# Patient Record
Sex: Male | Born: 1985 | Race: Black or African American | Hispanic: No | Marital: Married | Smoking: Never smoker
Health system: Southern US, Community
[De-identification: ages and names within clinical notes are randomized; demographics above are authoritative.]

## PROBLEM LIST (undated history)

## (undated) DIAGNOSIS — R002 Palpitations: Secondary | ICD-10-CM

## (undated) DIAGNOSIS — R42 Dizziness and giddiness: Secondary | ICD-10-CM

## (undated) DIAGNOSIS — R7989 Other specified abnormal findings of blood chemistry: Secondary | ICD-10-CM

## (undated) HISTORY — PX: NO PAST SURGERIES: SHX2092

## (undated) HISTORY — PX: WISDOM TOOTH EXTRACTION: SHX21

## (undated) HISTORY — DX: Other specified abnormal findings of blood chemistry: R79.89

## (undated) HISTORY — DX: Dizziness and giddiness: R42

## (undated) HISTORY — DX: Palpitations: R00.2

---

## 2019-08-31 NOTE — Progress Notes (Signed)
Cardiology Office Note:    Date:  09/01/2019   ID:  Louis Warren, DOB 1986-02-28, MRN [id number]  PCP:  Sueanne Margarita, DO  Cardiologist:  Shirlee More, MD   Referring MD: [facility]  ASSESSMENT:    1. Palpitations   2. FH: CAD (coronary artery disease)    PLAN:    In order of problems listed above:  1. He presents now with cardiac concerns as detailed to evaluate palpitation utilize the iPhone adapter and if we document arrhythmia see him back in the office sooner otherwise follow-up in 6 months to review strips.  Echocardiogram to exclude cardiomyopathy.  Lifestyle modification to reduce his lifelong risk of diabetes.  And finally avoiding over-the-counter proarrhythmic drugs that can precipitate arrhythmia.  Next appointment in 6 months   Medication Adjustments/Labs and Tests Ordered: Current medicines are reviewed at length with the patient today.  Concerns regarding medicines are outlined above.  No orders of the defined types were placed in this encounter.  No orders of the defined types were placed in this encounter.    Chief Complaint  Patient presents with  . Palpitations    History of Present Illness:    Louis Warren is a 33 y.o. male who is being seen today for the evaluation of palpitation at the request of Pa, [facility]  Tramon has become cardiac concern.  The first issue is rarely he gets brief episode of rapid heart rhythm perhaps once a month severe or sustained.  Uses over-the-counter sedating antihistamines.  His second concern is his mother with diabetes and barriers to health care died in her 31s of heart disease.  Very vigorous active man and rarely gets discomfort in the left chest he describes a soreness is nonanginal in nature better with massage and resolve spontaneously he has no exertional chest pain dyspnea or syncope.  Is no history of congenital or rheumatic heart disease.  His hemoglobin A1c is ideal lipids are good  and EKG is normal.  We discussed further evaluation and I think the best approach is to use the adapter for the smart phone to capture episodes occur so infrequently.  With his family history nonanginal chest pain palpitation echocardiogram to exclude underlying structural heart disease particularly cardiomyopathy and I do not think he requires an ischemia evaluation or coronary calcium score at this time.  I strongly encouraged him regarding lifestyle avoiding weight gain and inactivity being everything possible to avoid diabetes and prediabetes.  He has good healthcare literacy and comprehension. Past Medical History:  Diagnosis Date  . Dizziness   . Palpitations     Past Surgical History:  Procedure Laterality Date  . NO PAST SURGERIES      Current Medications: No outpatient medications have been marked as taking for the 09/01/19 encounter (Office Visit) with Richardo Priest, MD.     Allergies:   Patient has no known allergies.   Social History   Socioeconomic History  . Marital status: Married    Spouse name: Not on file  . Number of children: Not on file  . Years of education: Not on file  . Highest education level: Not on file  Occupational History  . Not on file  Social Needs  . Financial resource strain: Not on file  . Food insecurity    Worry: Not on file    Inability: Not on file  . Transportation needs    Medical: Not on file    Non-medical: Not  on file  Tobacco Use  . Smoking status: Never Smoker  . Smokeless tobacco: Never Used  Substance and Sexual Activity  . Alcohol use: Yes    Comment: occ-1 time per month  . Drug use: Never  . Sexual activity: Not on file  Lifestyle  . Physical activity    Days per week: Not on file    Minutes per session: Not on file  . Stress: Not on file  Relationships  . Social Musician on phone: Not on file    Gets together: Not on file    Attends religious service: Not on file    Active member of club or  organization: Not on file    Attends meetings of clubs or organizations: Not on file    Relationship status: Not on file  Other Topics Concern  . Not on file  Social History Narrative  . Not on file     Family History: The patient's family history includes Diabetes in his brother, father, maternal grandmother, and mother; Heart disease in his mother; Hypertension in his mother.  ROS:   Review of Systems  Constitution: Negative.  HENT: Positive for congestion.   Eyes: Negative.   Cardiovascular: Positive for chest pain and palpitations.  Respiratory: Negative.   Endocrine: Negative.   Skin: Negative.   Musculoskeletal: Positive for back pain.  Gastrointestinal: Negative.   Genitourinary: Negative.   Neurological: Negative.   Psychiatric/Behavioral: Negative.   Allergic/Immunologic: Negative.    Please see the history of present illness.     All other systems reviewed and are negative.  EKGs/Labs/Other Studies Reviewed:    The following studies were reviewed today:   EKG:  EKG is  ordered today.  The ekg ordered today is personally reviewed and demonstrates sinus rhythm EKG is normal  Recent Labs: IMA globin A1c 5.2% cholesterol 201 HDL 68 LDL 122 creatinine 9.24 normal TSH  Physical Exam:    VS:  BP 122/78 (BP Location: Left Arm, Patient Position: Sitting, Cuff Size: Large)   Pulse 68   Ht 6\' 5"  (1.956 m)   Wt 236 lb (107 kg)   SpO2 98%   BMI 27.99 kg/m     Wt Readings from Last 3 Encounters:  09/01/19 236 lb (107 kg)     GEN:  Well nourished, well developed in no acute distress HEENT: Normal NECK: No JVD; No carotid bruits LYMPHATICS: No lymphadenopathy CARDIAC: RRR, no murmurs, rubs, gallops RESPIRATORY:  Clear to auscultation without rales, wheezing or rhonchi  ABDOMEN: Soft, non-tender, non-distended MUSCULOSKELETAL:  No edema; No deformity  SKIN: Warm and dry NEUROLOGIC:  Alert and oriented x 3 PSYCHIATRIC:  Normal affect     Signed, 09/03/19, MD  09/01/2019 1:36 PM    [facility]

## 2019-09-01 ENCOUNTER — Other Ambulatory Visit: Payer: Self-pay

## 2019-09-01 ENCOUNTER — Encounter: Payer: Self-pay | Admitting: Cardiology

## 2019-09-01 ENCOUNTER — Ambulatory Visit (INDEPENDENT_AMBULATORY_CARE_PROVIDER_SITE_OTHER): Payer: BC Managed Care – PPO | Admitting: Cardiology

## 2019-09-01 VITALS — BP 122/78 | HR 68 | Ht 77.0 in | Wt 236.0 lb

## 2019-09-01 DIAGNOSIS — R002 Palpitations: Secondary | ICD-10-CM

## 2019-09-01 DIAGNOSIS — R42 Dizziness and giddiness: Secondary | ICD-10-CM | POA: Diagnosis not present

## 2019-09-01 DIAGNOSIS — Z8249 Family history of ischemic heart disease and other diseases of the circulatory system: Secondary | ICD-10-CM

## 2019-09-01 NOTE — Patient Instructions (Addendum)
Medication Instructions:  Your physician recommends that you continue on your current medications as directed. Please refer to the Current Medication list given to you today.  *If you need a refill on your cardiac medications before your next appointment, please call your pharmacy*  Lab Work: NONE If you have labs (blood work) drawn today and your tests are completely normal, you will receive your results only by: Marland Kitchen MyChart Message (if you have MyChart) OR . A paper copy in the mail If you have any lab test that is abnormal or we need to change your treatment, we will call you to review the results.  Testing/Procedures: You had an EKG Today  Your physician has requested that you have an echocardiogram. Echocardiography is a painless test that uses sound waves to create images of your heart. It provides your doctor with information about the size and shape of your heart and how well your heart's chambers and valves are working. This procedure takes approximately one hour. There are no restrictions for this procedure.    Follow-Up: At [facility], you and your health needs are our priority.  As part of our continuing mission to provide you with exceptional heart care, we have created designated Provider Care Teams.  These Care Teams include your primary Cardiologist (physician) and Advanced Practice Providers (APPs -  Physician Assistants and Nurse Practitioners) who all work together to provide you with the care you need, when you need it.  Your next appointment:   6 months  The format for your next appointment:   In Person  Provider:   Norman Herrlich, MD    [facility] [email or web address]        FDA-cleared, clinical grade mobile EKG monitor: Lourena Simmonds is the most clinically-validated mobile EKG used by the world's leading cardiac care medical professionals With Basic service, know instantly if your heart rhythm is normal or if atrial fibrillation  is detected, and email the last single EKG recording to yourself or your doctor Premium service, available for purchase through the Kardia app for $9.99 per month or $99 per year, includes unlimited history and storage of your EKG recordings, a monthly EKG summary report to share with your doctor, along with the ability to track your blood pressure, activity and weight Includes one KardiaMobile phone clip FREE SHIPPING: Standard delivery 1-3 business days. Orders placed by 11:00am PST will ship that afternoon. Otherwise, will ship next business day. All orders ship via [employer] from [place], [place]    1. Avoid all over-the-counter antihistamines except Claritin/Loratadine and Zyrtec/Cetrizine. 2. Avoid all combination including cold sinus allergies flu decongestant and sleep medications 3. You can use Robitussin DM Mucinex and Mucinex DM for cough. 4. can use Tylenol aspirin ibuprofen and naproxen but no combinations such as sleep or sinus.    Echocardiogram An echocardiogram is a procedure that uses painless sound waves (ultrasound) to produce an image of the heart. Images from an echocardiogram can provide important information about:  Signs of coronary artery disease (CAD).  Aneurysm detection. An aneurysm is a weak or damaged part of an artery wall that bulges out from the normal force of blood pumping through the body.  Heart size and shape. Changes in the size or shape of the heart can be associated with certain conditions, including heart failure, aneurysm, and CAD.  Heart muscle function.  Heart valve function.  Signs of a past heart attack.  Fluid buildup around the heart.  Thickening of the heart muscle.  A tumor  or infectious growth around the heart valves. Tell a health care provider about:  Any allergies you have.  All medicines you are taking, including vitamins, herbs, eye drops, creams, and over-the-counter medicines.  Any blood disorders you have.  Any  surgeries you have had.  Any medical conditions you have.  Whether you are pregnant or may be pregnant. What are the risks? Generally, this is a safe procedure. However, problems may occur, including:  Allergic reaction to dye (contrast) that may be used during the procedure. What happens before the procedure? No specific preparation is needed. You may eat and drink normally. What happens during the procedure?   An IV tube may be inserted into one of your veins.  You may receive contrast through this tube. A contrast is an injection that improves the quality of the pictures from your heart.  A gel will be applied to your chest.  A wand-like tool (transducer) will be moved over your chest. The gel will help to transmit the sound waves from the transducer.  The sound waves will harmlessly bounce off of your heart to allow the heart images to be captured in real-time motion. The images will be recorded on a computer. The procedure may vary among health care providers and hospitals. What happens after the procedure?  You may return to your normal, everyday life, including diet, activities, and medicines, unless your health care provider tells you not to do that. Summary  An echocardiogram is a procedure that uses painless sound waves (ultrasound) to produce an image of the heart.  Images from an echocardiogram can provide important information about the size and shape of your heart, heart muscle function, heart valve function, and fluid buildup around your heart.  You do not need to do anything to prepare before this procedure. You may eat and drink normally.  After the echocardiogram is completed, you may return to your normal, everyday life, unless your health care provider tells you not to do that. This information is not intended to replace advice given to you by your health care provider. Make sure you discuss any questions you have with your health care provider. Document  Released: 10/24/2000 Document Revised: 02/17/2019 Document Reviewed: 11/29/2016 Elsevier Patient Education  2020 [employer].

## 2019-09-08 ENCOUNTER — Ambulatory Visit (HOSPITAL_BASED_OUTPATIENT_CLINIC_OR_DEPARTMENT_OTHER)
Admission: RE | Admit: 2019-09-08 | Discharge: 2019-09-08 | Disposition: A | Discharge status: Home / Self Care | Payer: BC Managed Care – PPO | Source: Ambulatory Visit | Attending: Cardiology | Admitting: Cardiology

## 2019-09-08 ENCOUNTER — Other Ambulatory Visit: Payer: Self-pay

## 2019-09-08 DIAGNOSIS — Z8249 Family history of ischemic heart disease and other diseases of the circulatory system: Secondary | ICD-10-CM | POA: Diagnosis present

## 2019-09-08 DIAGNOSIS — R42 Dizziness and giddiness: Secondary | ICD-10-CM | POA: Insufficient documentation

## 2019-09-08 DIAGNOSIS — R002 Palpitations: Secondary | ICD-10-CM | POA: Diagnosis not present

## 2019-09-08 NOTE — Progress Notes (Signed)
  Echocardiogram 2D Echocardiogram has been performed.  Louis Warren 09/08/2019, 8:47 AM

## 2020-04-23 ENCOUNTER — Other Ambulatory Visit: Payer: Self-pay | Admitting: Internal Medicine

## 2020-04-23 DIAGNOSIS — R7989 Other specified abnormal findings of blood chemistry: Secondary | ICD-10-CM

## 2020-05-01 ENCOUNTER — Ambulatory Visit
Admission: RE | Admit: 2020-05-01 | Discharge: 2020-05-01 | Disposition: A | Discharge status: Home / Self Care | Payer: BC Managed Care – PPO | Source: Ambulatory Visit | Attending: Internal Medicine | Admitting: Internal Medicine

## 2020-05-01 DIAGNOSIS — R7989 Other specified abnormal findings of blood chemistry: Secondary | ICD-10-CM

## 2020-06-25 ENCOUNTER — Encounter: Payer: Self-pay | Admitting: Gastroenterology

## 2020-08-21 ENCOUNTER — Encounter: Payer: Self-pay | Admitting: *Deleted

## 2020-08-23 ENCOUNTER — Encounter: Payer: Self-pay | Admitting: Gastroenterology

## 2020-08-23 ENCOUNTER — Ambulatory Visit (INDEPENDENT_AMBULATORY_CARE_PROVIDER_SITE_OTHER): Payer: BC Managed Care – PPO | Admitting: Gastroenterology

## 2020-08-23 VITALS — BP 108/88 | HR 58 | Ht 77.0 in | Wt 236.0 lb

## 2020-08-23 DIAGNOSIS — R748 Abnormal levels of other serum enzymes: Secondary | ICD-10-CM

## 2020-08-23 NOTE — Patient Instructions (Addendum)
Your provider has requested that you go to the basement level for lab work after fasting (no food or drink with the exception of water) for at least 8 hours. Our lab is open from Monday 7:30 am-Friday 5:00 pm. Press "B" on the elevator. The lab is located at the first door on the left as you exit the elevator.  We will make further recommendations for additional labs after reviewing results of the labs from today as well as information from [facility].  If your enzymes remain elevated, without an answer, will proceed with an MRI/MRCP for further evaluation.  If you are age 35 or younger, your body mass index should be between 19-25. Your Body mass index is 27.99 kg/m. If this is out of the aformentioned range listed, please consider follow up with your Primary Care Provider.   Due to recent changes in healthcare laws, you may see the results of your imaging and laboratory studies on MyChart before your provider has had a chance to review them.  We understand that in some cases there may be results that are confusing or concerning to you. Not all laboratory results come back in the same time frame and the provider may be waiting for multiple results in order to interpret others.  Please give [place] 48 hours in order for your provider to thoroughly review all the results before contacting the office for clarification of your results.

## 2020-08-23 NOTE — Progress Notes (Signed)
Referring Provider: Sueanne Margarita, DO Primary Care Physician:  Sueanne Margarita, DO  Reason for Consultation:  Abnormal liver enzymes   IMPRESSION:  Abnormal transaminases ALT>AST    - normal ultrasound 05/01/20    - testing for chronic HBV and HCV was negative Recent unintentional weight gain [place] Fat burner product use, no longer using  Suspected hepatocellular process given the elevated transaminases. Will proceed with labs to evaluate for common causes of hepatocellular injury including: iron, ferritin, fasting insulin, fasting glucose,and autoimmune type I (ANA, AMA, IgG, IGM), ceruloplasm. If these labs are non-diagnostic, will proceed with alpha-1-antitrypsin, anti-LKM antibody, TSH, and celiac serologies.  Low threshold to proceed with MRI/MRCP.     PLAN: - Obtain labs and ultrasound results from Dr. Francesco Sor (hepatitis serologies) - fasting ferritin, fasting insulin, fasting glucose, iron, ANA, AMA, anti-smooth muscle antibody, IgG, IgM. ceruloplasmin - Will make recommendations for additional labs after reviewing those results. If the enzymes remain elevated without an answer will proceed with MRI/MRCP.   Please see the "Patient Instructions" section for addition details about the plan.  HPI: Louis Warren is a 34 y.o. male referred by Dr. Francesco Sor for abnormal liver enzymes. The history is obtained through the patient, some records provided by Dr. Francesco Sor, and the labs that the patient accesses on his electronic health record over the phone during his visit.    He has a history of eczema. 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 His wife works for [employer].   The patient notes that his liver enzymes have been found to be elevated on two consecutive lab draws. No prior knowledge of elevated liver enzymes. No associated symptoms.   Results available to me through records from Dr. Francesco Sor and the patient's online medical record that he accessed during this visit AST 59, ALT 96, alk phos  78 HBsAb immunity, HCV Ab, HsAg HBcAb negative Abdominal ultrasound was normal 05/01/20  Prior blood donation 5-10 years.  No prior blood transfusion.  No history of jaundice or scleral icterus.  No history of use or experimentation with IV or intranasal street drugs.  Will drink wine or beer - one every 1-3 days, sometimes not more than monthly. No history of heavy drinking or DUI. No history of autoimmune disease.  No family history of liver disease.  Used a Fat Burner [employer] supplement 2-3 times weekly.  He has recently gained 5-10 pounds due to inactivity.   He think he was vaccinated for HAV and HBV  No known family history of colon cancer or polyps. No family history of uterine/endometrial cancer, pancreatic cancer or gastric/stomach cancer.   Past Medical History:  Diagnosis Date  . Dizziness   . Elevated LFTs   . Palpitations     Past Surgical History:  Procedure Laterality Date  . NO PAST SURGERIES    . WISDOM TOOTH EXTRACTION      No current outpatient medications on file.   No current facility-administered medications for this visit.    Allergies as of 08/23/2020  . (No Known Allergies)    Family History  Problem Relation Age of Onset  . Heart disease Mother   . Hypertension Mother   . Diabetes Mother   . Diabetes Father   . Diabetes Brother   . Diabetes Maternal Grandmother   . Colon cancer Neg Hx   . Stomach cancer Neg Hx   . Esophageal cancer Neg Hx   . Pancreatic cancer Neg Hx     Social History  Socioeconomic History  . Marital status: Married    Spouse name: Not on file  . Number of children: Not on file  . Years of education: Not on file  . Highest education level: Not on file  Occupational History  . Not on file  Tobacco Use  . Smoking status: Never Smoker  . Smokeless tobacco: Never Used  Vaping Use  . Vaping Use: Never used  Substance and Sexual Activity  . Alcohol use: Yes    Comment: occ-1 time per month  . Drug use: Never  .  Sexual activity: Not on file  Other Topics Concern  . Not on file  Social History Narrative  . Not on file   Social Determinants of Health   Financial Resource Strain:   . Difficulty of Paying Living Expenses: Not on file  Food Insecurity:   . Worried About Charity fundraiser in the Last Year: Not on file  . Ran Out of Food in the Last Year: Not on file  Transportation Needs:   . Lack of Transportation (Medical): Not on file  . Lack of Transportation (Non-Medical): Not on file  Physical Activity:   . Days of Exercise per Week: Not on file  . Minutes of Exercise per Session: Not on file  Stress:   . Feeling of Stress : Not on file  Social Connections:   . Frequency of Communication with Friends and Family: Not on file  . Frequency of Social Gatherings with Friends and Family: Not on file  . Attends Religious Services: Not on file  . Active Member of Clubs or Organizations: Not on file  . Attends Archivist Meetings: Not on file  . Marital Status: Not on file  Intimate Partner Violence:   . Fear of Current or Ex-Partner: Not on file  . Emotionally Abused: Not on file  . Physically Abused: Not on file  . Sexually Abused: Not on file    Review of Systems: 12 system ROS is negative except as noted above.   Physical Exam: General:   Alert, in NAD. No scleral icterus. No bilateral temporal wasting.  Heart:  Regular rate and rhythm; no murmurs Pulm: Clear anteriorly; no wheezing Abdomen:  Soft. Nontender. Nondistended. Normal bowel sounds. No rebound or guarding. No fluid wave.  LAD: No inguinal or umbilical LAD Extremities:  Without edema. Neurologic:  Alert and  oriented x4;  grossly normal neurologically; no asterixis or clonus. Skin: No jaundice. No palmar erythema or spider angioma.  Psych:  Alert and cooperative. Normal mood and affect.    Kippy Melena L. Tarri Glenn, MD, MPH 08/23/2020, 2:41 PM

## 2020-08-24 ENCOUNTER — Other Ambulatory Visit (INDEPENDENT_AMBULATORY_CARE_PROVIDER_SITE_OTHER): Payer: BC Managed Care – PPO

## 2020-08-24 DIAGNOSIS — R748 Abnormal levels of other serum enzymes: Secondary | ICD-10-CM | POA: Diagnosis not present

## 2020-08-24 LAB — FERRITIN: Ferritin: 202.7 ng/mL (ref 22.0–322.0)

## 2020-08-24 LAB — IRON: Iron: 72 ug/dL (ref 42–165)

## 2020-08-24 LAB — GLUCOSE, RANDOM: Glucose, Bld: 89 mg/dL (ref 70–99)

## 2020-08-28 LAB — IGG: IgG (Immunoglobin G), Serum: 2139 mg/dL — ABNORMAL HIGH (ref 600–1640)

## 2020-08-28 LAB — CERULOPLASMIN: Ceruloplasmin: 35 mg/dL (ref 18–36)

## 2020-08-28 LAB — IGM: IgM, Serum: 82 mg/dL (ref 50–300)

## 2020-08-28 LAB — ANTI-SMOOTH MUSCLE ANTIBODY, IGG: Actin (Smooth Muscle) Antibody (IGG): 30 U — ABNORMAL HIGH (ref <20)

## 2020-08-28 LAB — MITOCHONDRIAL ANTIBODIES: Mitochondrial M2 Ab, IgG: <=20 U

## 2020-08-28 LAB — ANA: Anti Nuclear Antibody (ANA): NEGATIVE

## 2020-09-01 LAB — INSULIN, FREE AND TOTAL
Free Insulin: 12 uU/mL
Total Insulin: 12 uU/mL

## 2020-09-06 ENCOUNTER — Other Ambulatory Visit: Payer: Self-pay

## 2020-09-06 DIAGNOSIS — R748 Abnormal levels of other serum enzymes: Secondary | ICD-10-CM

## 2020-09-24 ENCOUNTER — Other Ambulatory Visit: Payer: Self-pay | Admitting: Radiology

## 2020-09-24 ENCOUNTER — Other Ambulatory Visit: Payer: Self-pay | Admitting: Student

## 2020-09-25 ENCOUNTER — Other Ambulatory Visit: Payer: Self-pay

## 2020-09-25 ENCOUNTER — Ambulatory Visit (HOSPITAL_COMMUNITY)
Admission: RE | Admit: 2020-09-25 | Discharge: 2020-09-25 | Disposition: A | Discharge status: Home / Self Care | Payer: BC Managed Care – PPO | Source: Ambulatory Visit | Attending: Gastroenterology | Admitting: Gastroenterology

## 2020-09-25 ENCOUNTER — Encounter (HOSPITAL_COMMUNITY): Payer: Self-pay

## 2020-09-25 DIAGNOSIS — K760 Fatty (change of) liver, not elsewhere classified: Secondary | ICD-10-CM | POA: Diagnosis not present

## 2020-09-25 DIAGNOSIS — R748 Abnormal levels of other serum enzymes: Secondary | ICD-10-CM | POA: Diagnosis present

## 2020-09-25 LAB — CBC
HCT: 46.2 % (ref 39.0–52.0)
Hemoglobin: 14.8 g/dL (ref 13.0–17.0)
MCH: 28.2 pg (ref 26.0–34.0)
MCHC: 32 g/dL (ref 30.0–36.0)
MCV: 88 fL (ref 80.0–100.0)
Platelets: 265 10*3/uL (ref 150–400)
RBC: 5.25 MIL/uL (ref 4.22–5.81)
RDW: 13.2 % (ref 11.5–15.5)
WBC: 4.2 10*3/uL (ref 4.0–10.5)
nRBC: 0 % (ref 0.0–0.2)

## 2020-09-25 LAB — APTT: aPTT: 29 s (ref 24–36)

## 2020-09-25 LAB — PROTIME-INR
INR: 1 (ref 0.8–1.2)
Prothrombin Time: 12.3 s (ref 11.4–15.2)

## 2020-09-25 MED ORDER — FENTANYL CITRATE (PF) 100 MCG/2ML IJ SOLN
INTRAMUSCULAR | Status: AC
  Filled 2020-09-25: qty 2

## 2020-09-25 MED ORDER — LIDOCAINE HCL (PF) 1 % IJ SOLN
INTRAMUSCULAR | Status: AC
  Filled 2020-09-25: qty 30

## 2020-09-25 MED ORDER — GELATIN ABSORBABLE 12-7 MM EX MISC
CUTANEOUS | Status: AC
  Filled 2020-09-25: qty 1

## 2020-09-25 MED ORDER — MIDAZOLAM HCL 2 MG/2ML IJ SOLN
INTRAMUSCULAR | Status: AC | PRN
  Administered 2020-09-25: 1 mg via INTRAVENOUS
  Administered 2020-09-25 (×2): 0.5 mg via INTRAVENOUS

## 2020-09-25 MED ORDER — FENTANYL CITRATE (PF) 100 MCG/2ML IJ SOLN
INTRAMUSCULAR | Status: AC | PRN
Start: 2020-09-25 — End: 2020-09-25
  Administered 2020-09-25: 25 ug via INTRAVENOUS
  Administered 2020-09-25: 50 ug via INTRAVENOUS
  Administered 2020-09-25: 25 ug via INTRAVENOUS

## 2020-09-25 MED ORDER — SODIUM CHLORIDE 0.9 % IV SOLN: INTRAVENOUS | Status: DC

## 2020-09-25 MED ORDER — MIDAZOLAM HCL 2 MG/2ML IJ SOLN
INTRAMUSCULAR | Status: AC
  Filled 2020-09-25: qty 2

## 2020-09-25 MED ORDER — SODIUM CHLORIDE 0.9 % IV SOLN
INTRAVENOUS | Status: AC | PRN
  Administered 2020-09-25: 10 mL/h via INTRAVENOUS

## 2020-09-25 MED ORDER — HYDROCODONE-ACETAMINOPHEN 5-325 MG PO TABS: 1.0000 | ORAL_TABLET | ORAL | Status: DC | PRN

## 2020-09-25 NOTE — Progress Notes (Signed)
Patient was given discharge instructions. He verbalized understanding. 

## 2020-09-25 NOTE — Discharge Instructions (Addendum)
Liver Biopsy, Care After These instructions give you information on caring for yourself after your procedure. Your doctor may also give you more specific instructions. Call your doctor if you have any problems or questions after your procedure. What can I expect after the procedure? After the procedure, it is common to have:  Pain and soreness where the biopsy was done.  Bruising around the area where the biopsy was done.  Sleepiness and be tired for a few days. Follow these instructions at home: Medicines  Take over-the-counter and prescription medicines only as told by your doctor.  If you were prescribed an antibiotic medicine, take it as told by your doctor. Do not stop taking the antibiotic even if you start to feel better.  Do not take medicines such as aspirin and ibuprofen. These medicines can thin your blood. Do not take these medicines unless your doctor tells you to take them.  If you are taking prescription pain medicine, take actions to prevent or treat constipation. Your doctor may recommend that you: ? Drink enough fluid to keep your pee (urine) clear or pale yellow. ? Take over-the-counter or prescription medicines. ? Eat foods that are high in fiber, such as fresh fruits and vegetables, whole grains, and beans. ? Limit foods that are high in fat and processed sugars, such as fried and sweet foods. Caring for your cut  Follow instructions from your doctor about how to take care of your cuts from surgery (incisions). Make sure you: ? Wash your hands with soap and water before you change your bandage (dressing). If you cannot use soap and water, use hand sanitizer. ? Change your bandage as told by your doctor. ? Leave stitches (sutures), skin glue, or skin tape (adhesive) strips in place. They may need to stay in place for 2 weeks or longer. If tape strips get loose and curl up, you may trim the loose edges. Do not remove tape strips completely unless your doctor says it is  okay.  Check your cuts every day for signs of infection. Check for: ? Redness, swelling, or more pain. ? Fluid or blood. ? Pus or a bad smell. ? Warmth.  Do not take baths, swim, or use a hot tub until your doctor says it is okay to do so. Activity   Rest at home for 1-2 days or as told by your doctor. ? Avoid sitting for a long time without moving. Get up to take short walks every 1-2 hours.  Return to your normal activities as told by your doctor. Ask what activities are safe for you.  Do not do these things in the first 24 hours: ? Drive. ? Use machinery. ? Take a bath or shower.  Do not lift more than 10 pounds (4.5 kg) or play contact sports for the first 2 weeks. General instructions   Do not drink alcohol in the first week after the procedure.  Have someone stay with you for at least 24 hours after the procedure.  Get your test results. Ask your doctor or the department that is doing the test: ? When will my results be ready? ? How will I get my results? ? What are my treatment options? ? What other tests do I need? ? What are my next steps?  Keep all follow-up visits as told by your doctor. This is important. Contact a doctor if:  A cut bleeds and leaves more than just a small spot of blood.  A cut is red, puffs up (  swells), or hurts more than before.  Fluid or something else comes from a cut.  A cut smells bad.  You have a fever or chills. Get help right away if:  You have swelling, bloating, or pain in your belly (abdomen).  You get dizzy or faint.  You have a rash.  You feel sick to your stomach (nauseous) or throw up (vomit).  You have trouble breathing, feel short of breath, or feel faint.  Your chest hurts.  You have problems talking or seeing.  You have trouble with your balance or moving your arms or legs. Summary  After the procedure, it is common to have pain, soreness, bruising, and tiredness.  Your doctor will tell you how to  take care of yourself at home. Change your bandage, take your medicines, and limit your activities as told by your doctor.  Call your doctor if you have symptoms of infection. Get help right away if your belly swells, your cut bleeds a lot, or you have trouble talking or breathing. This information is not intended to replace advice given to you by your health care provider. Make sure you discuss any questions you have with your health care provider. Document Revised: 11/06/2017 Document Reviewed: 11/06/2017 Elsevier Patient Education  2020 [employer]. Moderate Conscious Sedation, Adult Sedation is the use of medicines to promote relaxation and relieve discomfort and anxiety. Moderate conscious sedation is a type of sedation. Under moderate conscious sedation, you are less alert than normal, but you are still able to respond to instructions, touch, or both. Moderate conscious sedation is used during short medical and dental procedures. It is milder than deep sedation, which is a type of sedation under which you cannot be easily woken up. It is also milder than general anesthesia, which is the use of medicines to make you unconscious. Moderate conscious sedation allows you to return to your regular activities sooner. Tell a health care provider about:  Any allergies you have.  All medicines you are taking, including vitamins, herbs, eye drops, creams, and over-the-counter medicines.  Use of steroids (by mouth or creams).  Any problems you or family members have had with sedatives and anesthetic medicines.  Any blood disorders you have.  Any surgeries you have had.  Any medical conditions you have, such as sleep apnea.  Whether you are pregnant or may be pregnant.  Any use of cigarettes, alcohol, marijuana, or street drugs. What are the risks? Generally, this is a safe procedure. However, problems may occur, including:  Getting too much medicine (oversedation).  Nausea.  Allergic  reaction to medicines.  Trouble breathing. If this happens, a breathing tube may be used to help with breathing. It will be removed when you are awake and breathing on your own.  Heart trouble.  Lung trouble. What happens before the procedure? Staying hydrated Follow instructions from your health care provider about hydration, which may include:  Up to 2 hours before the procedure - you may continue to drink clear liquids, such as water, clear fruit juice, black coffee, and plain tea. Eating and drinking restrictions Follow instructions from your health care provider about eating and drinking, which may include:  8 hours before the procedure - stop eating heavy meals or foods such as meat, fried foods, or fatty foods.  6 hours before the procedure - stop eating light meals or foods, such as toast or cereal.  6 hours before the procedure - stop drinking milk or drinks that contain milk.  2   hours before the procedure - stop drinking clear liquids. Medicine Ask your health care provider about:  Changing or stopping your regular medicines. This is especially important if you are taking diabetes medicines or blood thinners.  Taking medicines such as aspirin and ibuprofen. These medicines can thin your blood. Do not take these medicines before your procedure if your health care provider instructs you not to.  Tests and exams  You will have a physical exam.  You may have blood tests done to show: ? How well your kidneys and liver are working. ? How well your blood can clot. General instructions  Plan to have someone take you home from the hospital or clinic.  If you will be going home right after the procedure, plan to have someone with you for 24 hours. What happens during the procedure?  An IV tube will be inserted into one of your veins.  Medicine to help you relax (sedative) will be given through the IV tube.  The medical or dental procedure will be performed. What  happens after the procedure?  Your blood pressure, heart rate, breathing rate, and blood oxygen level will be monitored often until the medicines you were given have worn off.  Do not drive for 24 hours. This information is not intended to replace advice given to you by your health care provider. Make sure you discuss any questions you have with your health care provider. Document Revised: 10/09/2017 Document Reviewed: 02/16/2016 Elsevier Patient Education  2020 Elsevier Inc.  

## 2020-09-25 NOTE — H&P (Signed)
Chief Complaint: Patient was seen in consultation today for random liver biopsy at the request of Beavers,Kimberly  Referring Physician(s): Beavers,Kimberly  Supervising Physician: Ruel Favors  Patient Status: [facility] - Out-pt  History of Present Illness: Louis Warren is a 34 y.o. male   Known elevated liver enzymes x almost 1 yr Denies abd pain; N/V; jaundice BMs wnl No drug use Hx Little to no alcohol  Was referred to Dr Orvan Falconer for eval:  Abnormal transaminases ALT>AST    - normal ultrasound 05/01/20    - testing for chronic HBV and HCV was negative  Scheduled now for liver core biopsy  Past Medical History:  Diagnosis Date  . Dizziness   . Elevated LFTs   . Palpitations     Past Surgical History:  Procedure Laterality Date  . NO PAST SURGERIES    . WISDOM TOOTH EXTRACTION      Allergies: Patient has no known allergies.  Medications: Prior to Admission medications   Medication Sig Start Date End Date Taking? Authorizing Provider  tretinoin (RETIN-A) 0.1 % cream Apply 1 application topically daily as needed (acne).  06/08/20  Yes [provider]     Family History  Problem Relation Age of Onset  . Heart disease Mother   . Hypertension Mother   . Diabetes Mother   . Diabetes Father   . Diabetes Brother   . Diabetes Maternal Grandmother   . Colon cancer Neg Hx   . Stomach cancer Neg Hx   . Esophageal cancer Neg Hx   . Pancreatic cancer Neg Hx     Social History   Socioeconomic History  . Marital status: Married    Spouse name: Not on file  . Number of children: Not on file  . Years of education: Not on file  . Highest education level: Not on file  Occupational History  . Not on file  Tobacco Use  . Smoking status: Never Smoker  . Smokeless tobacco: Never Used  Vaping Use  . Vaping Use: Never used  Substance and Sexual Activity  . Alcohol use: Yes    Comment: occ-1 time per month  . Drug use: Never  . Sexual activity: Not  on file  Other Topics Concern  . Not on file  Social History Narrative  . Not on file   Social Determinants of Health   Financial Resource Strain:   . Difficulty of Paying Living Expenses: Not on file  Food Insecurity:   . Worried About Programme researcher, broadcasting/film/video in the Last Year: Not on file  . Ran Out of Food in the Last Year: Not on file  Transportation Needs:   . Lack of Transportation (Medical): Not on file  . Lack of Transportation (Non-Medical): Not on file  Physical Activity:   . Days of Exercise per Week: Not on file  . Minutes of Exercise per Session: Not on file  Stress:   . Feeling of Stress : Not on file  Social Connections:   . Frequency of Communication with Friends and Family: Not on file  . Frequency of Social Gatherings with Friends and Family: Not on file  . Attends Religious Services: Not on file  . Active Member of Clubs or Organizations: Not on file  . Attends Banker Meetings: Not on file  . Marital Status: Not on file    Review of Systems: A 12 point ROS discussed and pertinent positives are indicated in the HPI above.  All other systems  are negative.  Review of Systems  Constitutional: Negative for activity change, fatigue, fever and unexpected weight change.  Respiratory: Negative for cough and shortness of breath.   Cardiovascular: Negative for chest pain.  Gastrointestinal: Negative for abdominal pain, nausea and vomiting.  Psychiatric/Behavioral: Negative for behavioral problems and confusion.    Vital Signs: BP 125/88   Pulse 67   Temp 98.1 F (36.7 C) (Oral)   Resp 16   Ht 6\' 5"  (1.956 m)   Wt 235 lb (106.6 kg)   SpO2 99%   BMI 27.87 kg/m   Physical Exam Vitals reviewed.  Cardiovascular:     Rate and Rhythm: Normal rate and regular rhythm.     Heart sounds: Normal heart sounds.  Pulmonary:     Effort: Pulmonary effort is normal.     Breath sounds: Normal breath sounds.  Abdominal:     Palpations: Abdomen is soft.      Tenderness: There is no abdominal tenderness.  Musculoskeletal:        General: Normal range of motion.  Skin:    General: Skin is warm.  Neurological:     Mental Status: He is alert and oriented to person, place, and time.  Psychiatric:        Behavior: Behavior normal.     Imaging: No results found.  Labs:  CBC: Recent Labs    09/25/20 0603  WBC 4.2  HGB 14.8  HCT 46.2  PLT 265    COAGS: Recent Labs    09/25/20 0603  INR 1.0  APTT 29    BMP: Recent Labs    08/24/20 0734  GLUCOSE 89    LIVER FUNCTION TESTS: No results for input(s): BILITOT, AST, ALT, ALKPHOS, PROT, ALBUMIN in the last 8760 hours.  TUMOR MARKERS: No results for input(s): AFPTM, CEA, CA199, CHROMGRNA in the last 8760 hours.  Assessment and Plan:  Elevated liver enzymes-- known x 1 yr All other work up neg thus far Now scheduled for random liver core bx Risks and benefits of random liver core bx was discussed with the patient and/or patient's family including, but not limited to bleeding, infection, damage to adjacent structures or low yield requiring additional tests.  All of the questions were answered and there is agreement to proceed. Consent signed and in chart.   Thank you for this interesting consult.  I greatly enjoyed meeting Louis Warren and look forward to participating in their care.  A copy of this report was sent to the requesting provider on this date.  Electronically Signed: Marga Hoots, PA-C 09/25/2020, 7:03 AM   I spent a total of  30 Minutes   in face to face in clinical consultation, greater than 50% of which was counseling/coordinating care for random liver core bx

## 2020-09-25 NOTE — Procedures (Signed)
Interventional Radiology Procedure Note  Procedure: US liver random bx    Complications: None  Estimated Blood Loss:  min  Findings: 18 g cores x 2    M. Ruel Favors, MD

## 2020-09-26 ENCOUNTER — Other Ambulatory Visit: Payer: Self-pay | Admitting: Physician Assistant

## 2020-09-26 LAB — SURGICAL PATHOLOGY

## 2020-10-01 ENCOUNTER — Encounter: Payer: Self-pay | Admitting: Gastroenterology

## 2020-10-01 ENCOUNTER — Ambulatory Visit (INDEPENDENT_AMBULATORY_CARE_PROVIDER_SITE_OTHER): Payer: BC Managed Care – PPO | Admitting: Gastroenterology

## 2020-10-01 VITALS — BP 122/82 | HR 76 | Ht 77.0 in | Wt 240.0 lb

## 2020-10-01 DIAGNOSIS — K760 Fatty (change of) liver, not elsewhere classified: Secondary | ICD-10-CM | POA: Diagnosis not present

## 2020-10-01 DIAGNOSIS — R140 Abdominal distension (gaseous): Secondary | ICD-10-CM

## 2020-10-01 DIAGNOSIS — R748 Abnormal levels of other serum enzymes: Secondary | ICD-10-CM

## 2020-10-01 NOTE — Progress Notes (Signed)
Referring Provider: Charlane Ferretti, DO Primary Care Physician:  Charlane Ferretti, DO  Chief complaint:  Abnormal liver enzymes   IMPRESSION:  Hepatic steatosis with steatohepatitis or fibrosis on liver biopsy 11/21 Abnormal transaminases ALT>AST due to fatty liver HOMA-IR 2.6 Recent unintentional weight gain [employer] Fat burner product use, no longer using  Reviewed the diagnosis of fatty liver without steatohepatitis or fibrosis. Given his elevated HOMA-IR, I have asked him to follow-up with Dr. Thornell Mule to determine if additional evaluation and/or treatment is needed for insulin resistance. Discussed strategies for management including lifestyle modification, maintaining normal blood glucose levels, work to maintain a healthy weight, and control lipids.    I recommended follow-up every 2 years, annually with the diagnosis of diabetes.   Etiology of bloating may be related to reflux. Recommended a trial of Pepcid AC. He will call me if symptoms persist or worsen to proceed with further evaluation.    PLAN: - Trial of Pepcid PRN - Reviewed lifestyle modifications as outlined in the patient instructions  - Follow-up in 2 years, earlier if necessary   HPI: Louis Warren is a 34 y.o. male who returns in follow-up for abnormal liver enzymes, ALT>AST.   Serologic evaluation includes negative testing for HCV, autoimmune hepatitis (ANA, IgG), PBC (AMA, IgM), Wilson's disease, iron overload. Glucose 89, insulin 12 for a HOMA-IR of 2.6.  He has hepatitis B immunity (HBsAb+).  Abdominal ultrasound was normal 05/01/20  Liver biopsy performed 09/25/20 showed mild steatosis. There was no steatohepatitis or fibrosis.  He had no complications related to the liver biopsy.  Only complaint today is of rare, intermittent bloating at the xiphoid process with eating.    Past Medical History:  Diagnosis Date  . Dizziness   . Elevated LFTs   . Palpitations     Past Surgical History:  Procedure  Laterality Date  . NO PAST SURGERIES    . WISDOM TOOTH EXTRACTION      Current Outpatient Medications  Medication Sig Dispense Refill  . tretinoin (RETIN-A) 0.1 % cream Apply 1 application topically daily as needed (acne).      No current facility-administered medications for this visit.    Allergies as of 10/01/2020  . (No Known Allergies)    Family History  Problem Relation Age of Onset  . Heart disease Mother   . Hypertension Mother   . Diabetes Mother   . Diabetes Father   . Diabetes Brother   . Diabetes Maternal Grandmother   . Colon cancer Neg Hx   . Stomach cancer Neg Hx   . Esophageal cancer Neg Hx   . Pancreatic cancer Neg Hx     Social History   Socioeconomic History  . Marital status: Married    Spouse name: Not on file  . Number of children: Not on file  . Years of education: Not on file  . Highest education level: Not on file  Occupational History  . Not on file  Tobacco Use  . Smoking status: Never Smoker  . Smokeless tobacco: Never Used  Vaping Use  . Vaping Use: Never used  Substance and Sexual Activity  . Alcohol use: Yes    Comment: occ-1 time per month  . Drug use: Never  . Sexual activity: Not on file  Other Topics Concern  . Not on file  Social History Narrative  . Not on file   Social Determinants of Health   Financial Resource Strain:   . Difficulty of Paying Living Expenses: Not  on file  Food Insecurity:   . Worried About Programme researcher, broadcasting/film/video in the Last Year: Not on file  . Ran Out of Food in the Last Year: Not on file  Transportation Needs:   . Lack of Transportation (Medical): Not on file  . Lack of Transportation (Non-Medical): Not on file  Physical Activity:   . Days of Exercise per Week: Not on file  . Minutes of Exercise per Session: Not on file  Stress:   . Feeling of Stress : Not on file  Social Connections:   . Frequency of Communication with Friends and Family: Not on file  . Frequency of Social Gatherings with  Friends and Family: Not on file  . Attends Religious Services: Not on file  . Active Member of Clubs or Organizations: Not on file  . Attends Banker Meetings: Not on file  . Marital Status: Not on file  Intimate Partner Violence:   . Fear of Current or Ex-Partner: Not on file  . Emotionally Abused: Not on file  . Physically Abused: Not on file  . Sexually Abused: Not on file    Physical Exam: General:   Alert, in NAD. No stigmata of chronic liver disease.  Abdomen:  Soft. Nontender. Nondistended. Normal bowel sounds. No rebound or guarding. No fluid wave. Well-healed biopsy scar in the midclavical line.  LAD: No inguinal or umbilical LAD Extremities:  Without edema. Neurologic:  Alert and  oriented x4;  grossly normal neurologically.    Tearia Gibbs L. Orvan Falconer, MD, MPH 10/01/2020, 9:20 AM

## 2020-10-01 NOTE — Patient Instructions (Addendum)
Your recent labs suggest insulin resistance with a HOMA-IR score 2.6. Dr. Thornell Mule may want to consider additional testing or treatment given this result.  Your liver biopsy shows fatty liver without any associated inflammation or damage. Unfortunately, there are no FDA-approved medications for fatty liver disease.   The good news is that the most effective treatment so far for fatty liver disease does not involve medications, but rather lifestyle changes. The bad news is that these are typically hard to achieve and maintain for many people. Here's what we know helps:  - It appears that aerobic exercise leads to decreased fat in the liver, and with vigorous intensity, possibly also decreased inflammation independent of weight loss. - Eat well. Some studies suggest that the Mediterranean diet may also decrease the fat in the liver. This nutrition plan emphasizes fruits, vegetables, whole grains, legumes, nuts, replacing butter with olive or canola oil, limiting red meat, and eating more fish and lean poultry. Avoid high fructose corn syrup, as this can be worsen fatty liver.  - Drink coffee. Some studies showed that patients with fatty liver who drank coffee (about two cups every day) had a decreased risk in fibrosis. However, take into consideration the downsides of regular caffeine intake.  We should monitor your liver over time. I would like to see you every two years. However, if you are diagnosed with diabetes, we should plan to see each other every year.  I recommend a trial of Pepcid AC as needed for your bloating. Please let me know if those symptoms are not responding to the treatment or getting worse so that we can work together to get you feeling better.   If you are age 35 or younger, your body mass index should be between 19-25. Your There is no height or weight on file to calculate BMI. If this is out of the aformentioned range listed, please consider follow up with your Primary Care  Provider.   Thank you for trusting me with your gastrointestinal care!    Tressia Danas, MD, MPH

## 2021-01-02 IMAGING — US US ABDOMEN LIMITED
1 series · 14 of 25 positions shown · non-contrast
Comparison: None.

CLINICAL DATA: Elevated liver function tests.

EXAM:
ULTRASOUND ABDOMEN LIMITED RIGHT UPPER QUADRANT

[Series 1: us abdomen limited · 0.12mm/px · 14 of 44 slices shown]
[im 1/44]
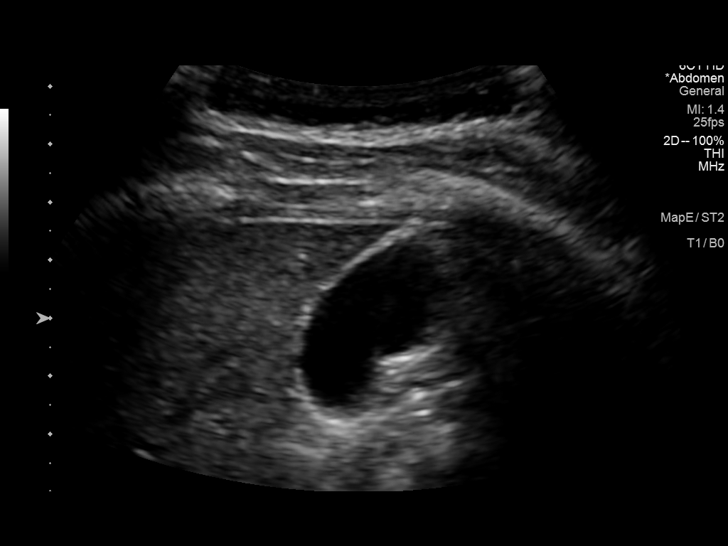
[im 4/44]
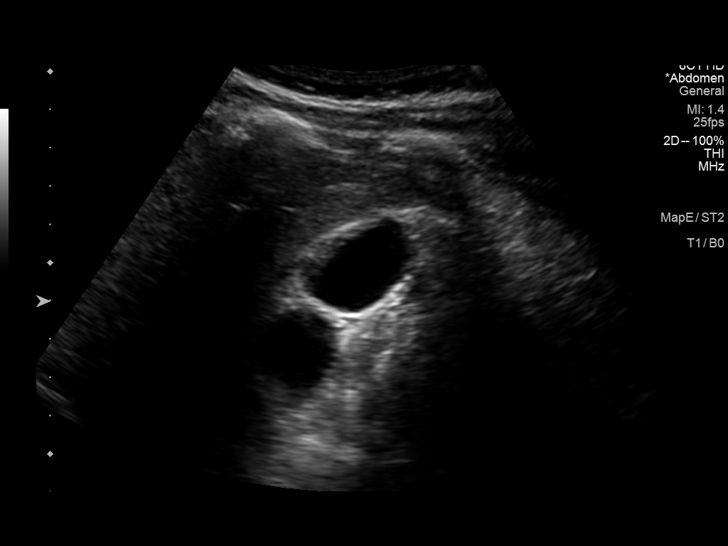
[im 8/44]
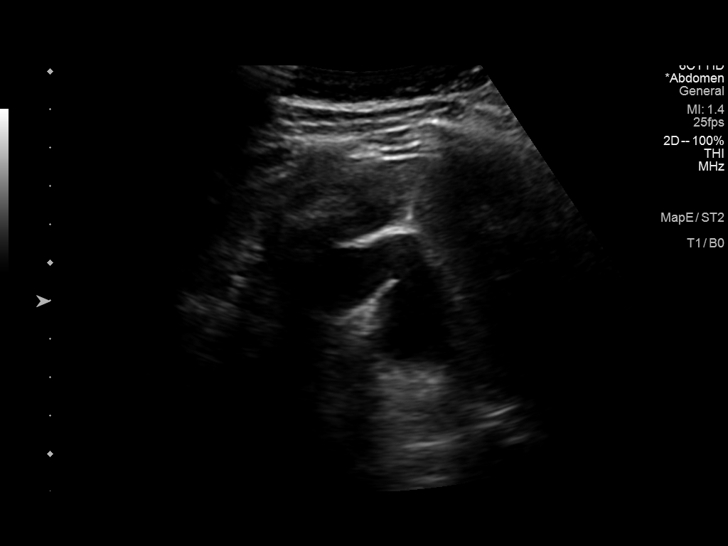
[im 11/44]
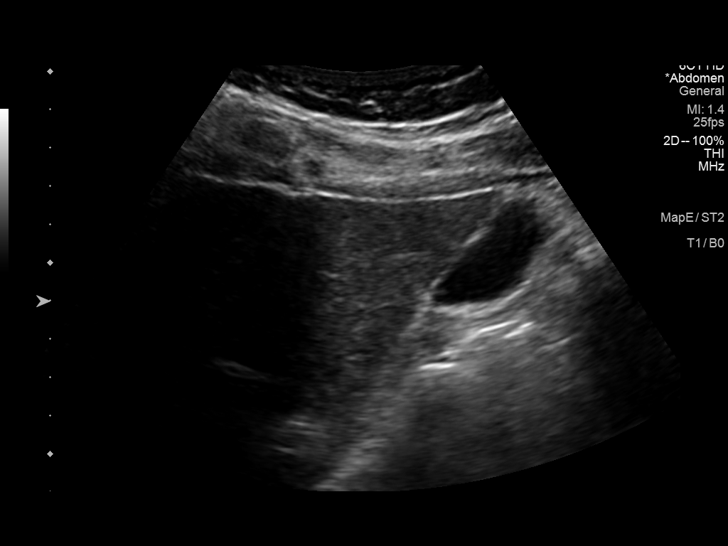
[im 15/44]
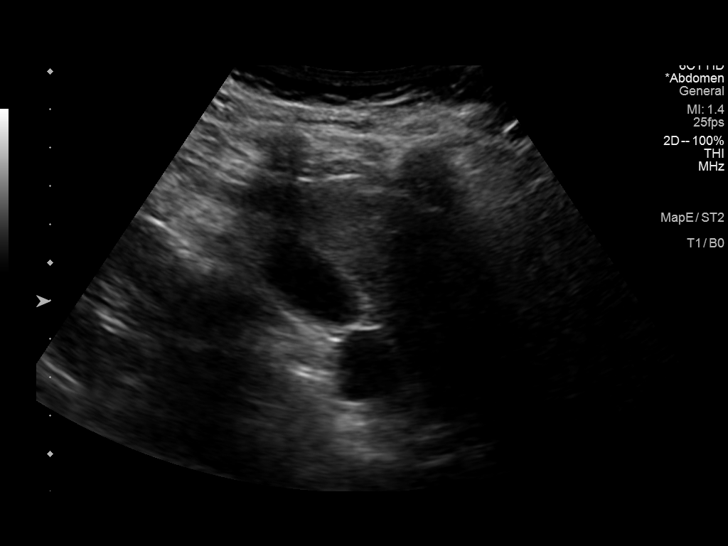
[im 17/44]
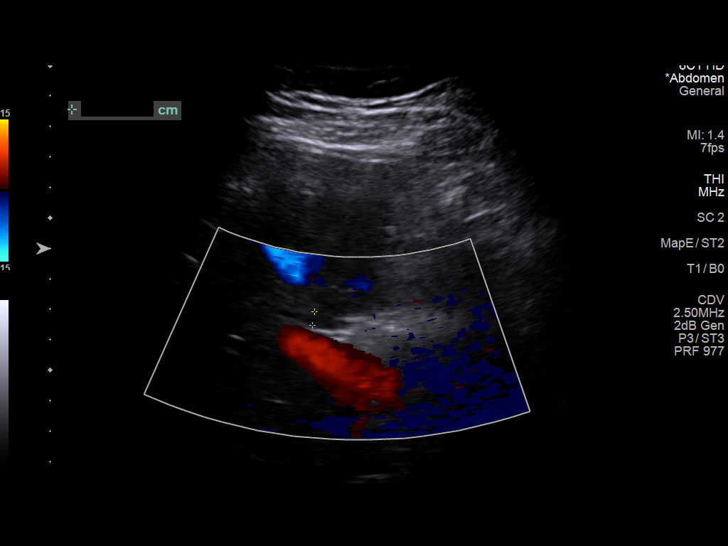
[im 20/44]
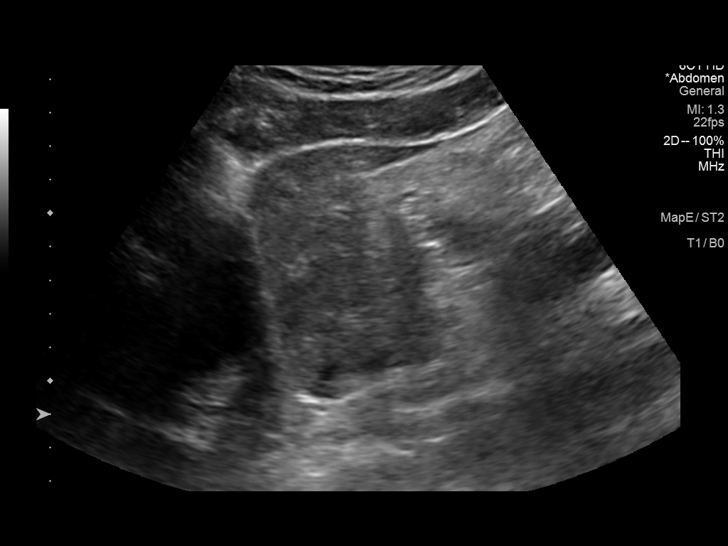
[im 24/44]
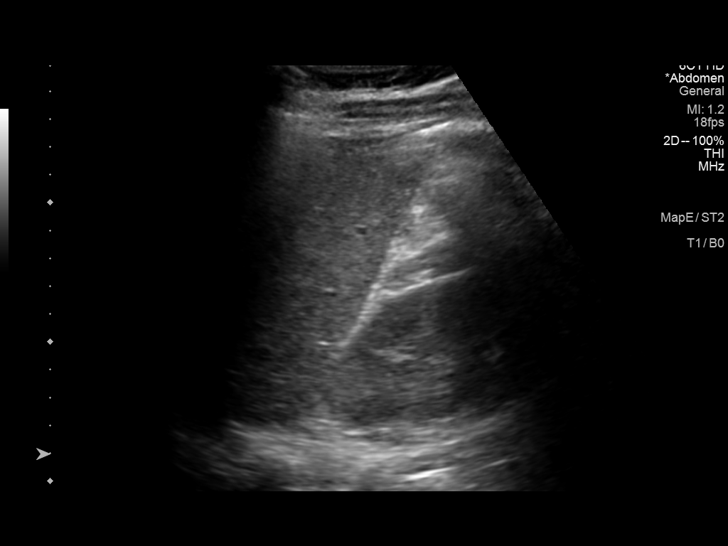
[im 27/44]
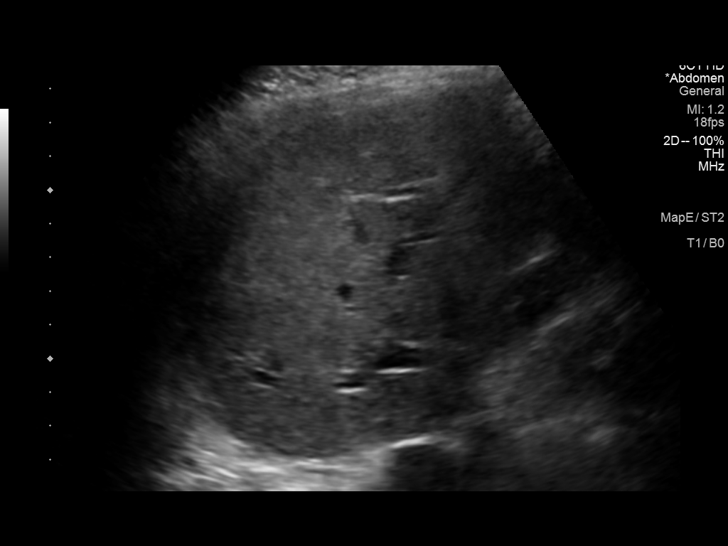
[im 29/44]
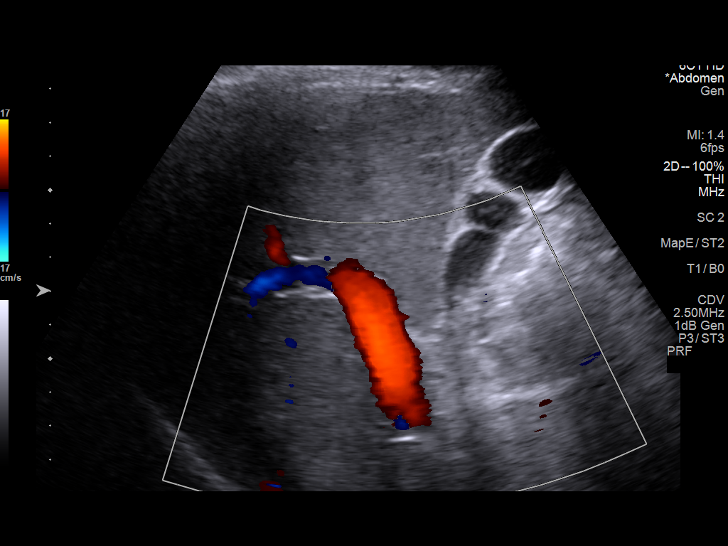
[im 33/44]
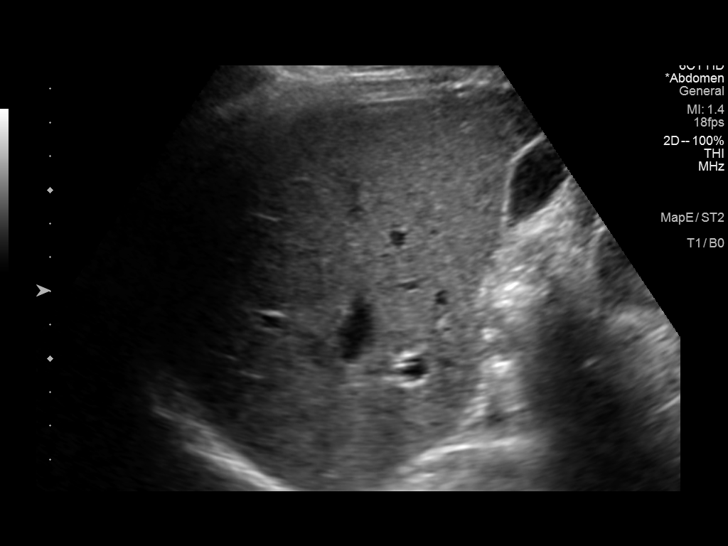
[im 36/44]
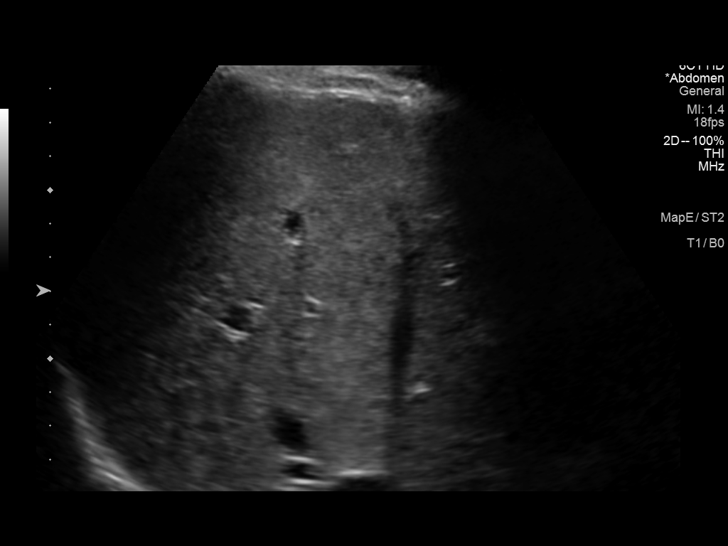
[im 40/44]
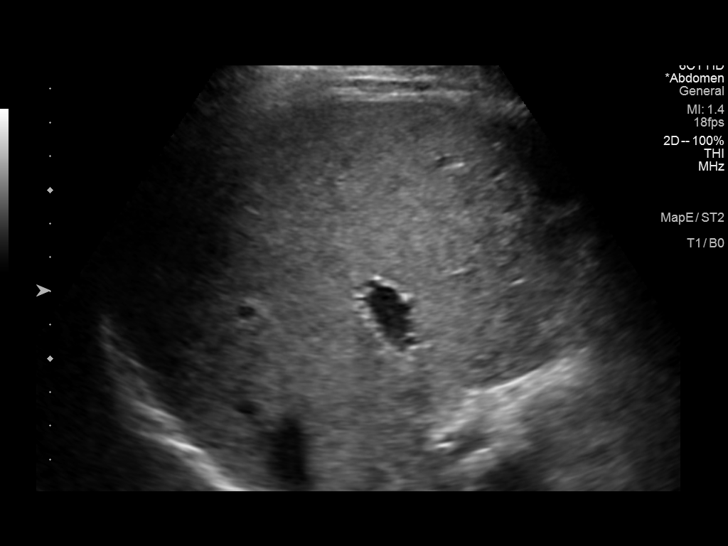
[im 44/44]
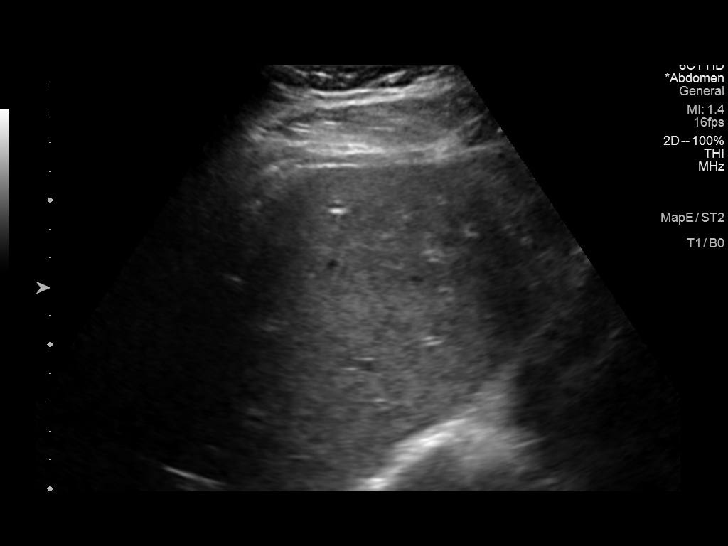

[14 of 25 positions shown; findings below may reference images not displayed]

FINDINGS: Gallbladder:

No gallstones or wall thickening visualized. No sonographic Murphy
sign noted by sonographer.

Common bile duct:

Diameter: Normal, 5 mm.

Liver:

No focal lesion identified. Within normal limits in parenchymal
echogenicity. Portal vein is patent on color Doppler imaging with
normal direction of blood flow towards the liver.

Other: None.
IMPRESSION: Normal right upper quadrant ultrasound. No explanation for elevated
liver function tests.

## 2021-05-29 IMAGING — US US BIOPSY CORE LIVER
1 series · 7 of 7 positions shown · non-contrast
Comparison: none

INDICATION: Elevated LFTs

[Series 1: us biopsy (liver) · 7 of 7 slices shown]
[im 1/7]
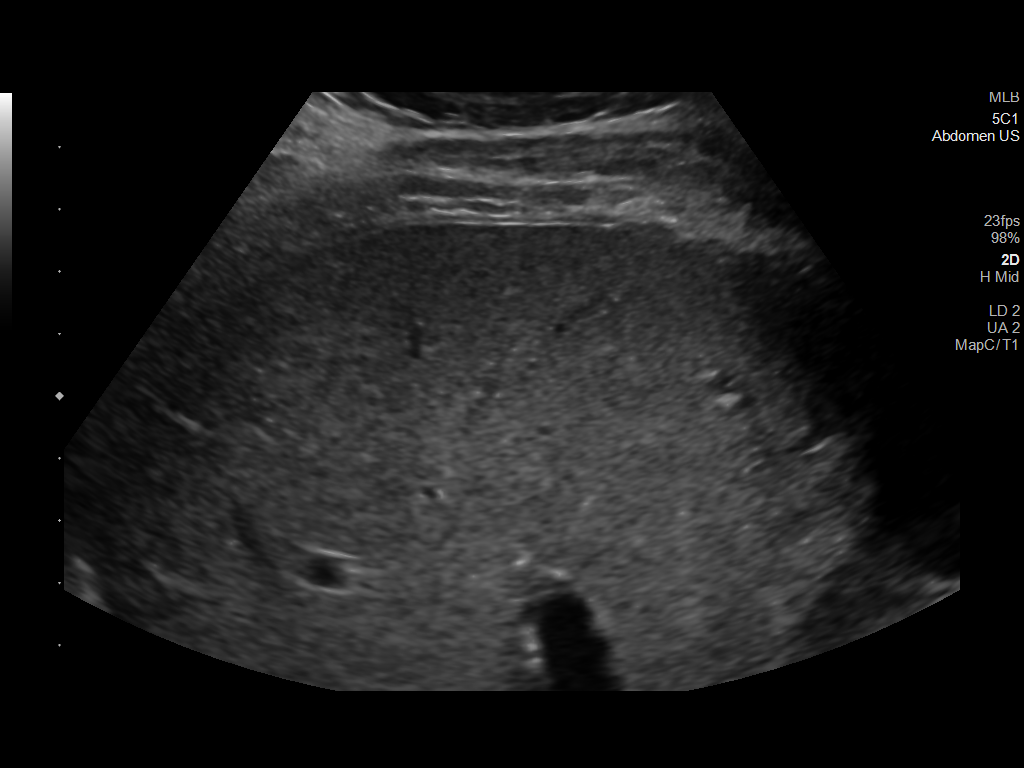
[im 2/7]
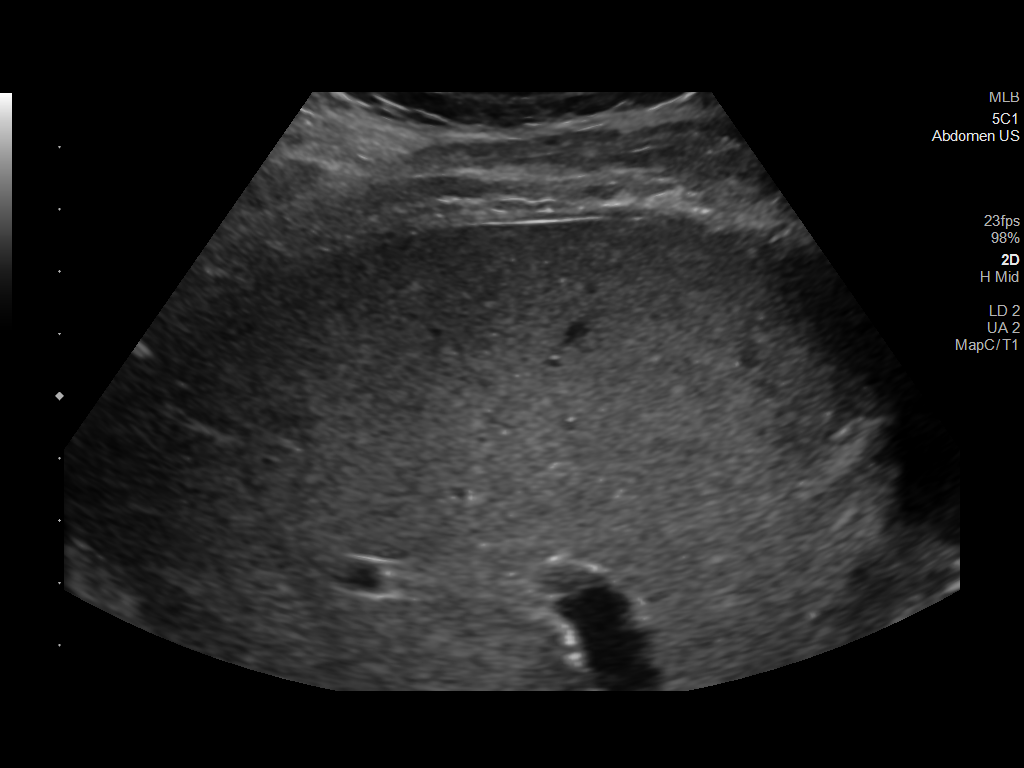
[im 3/7]
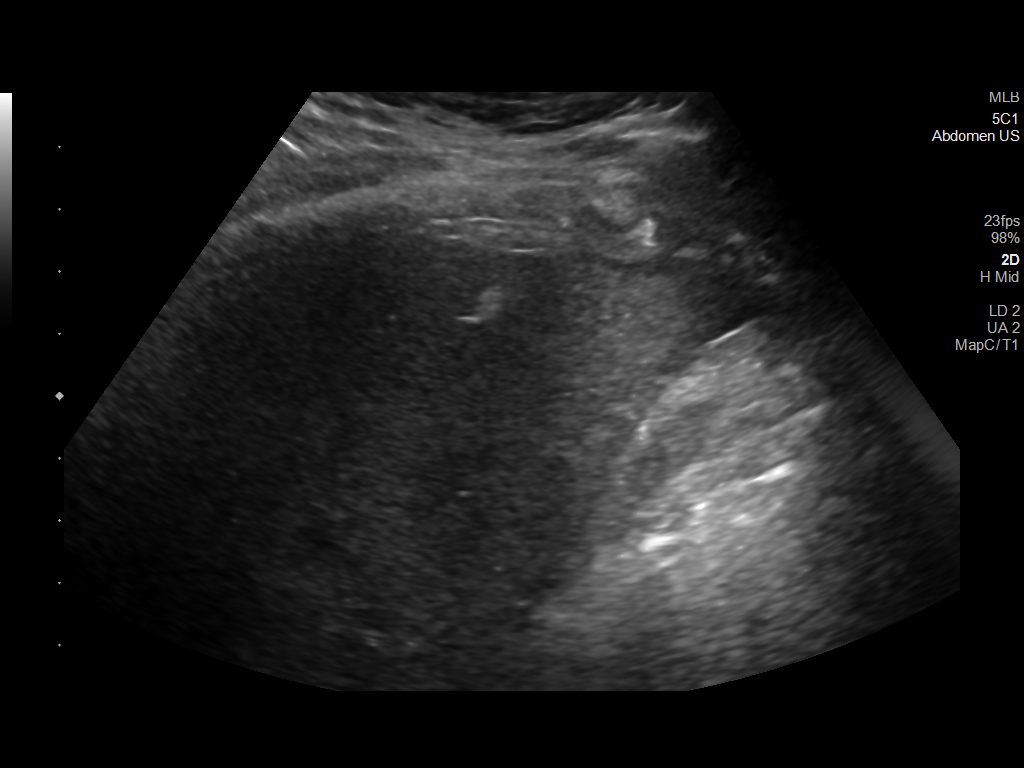
[im 4/7]
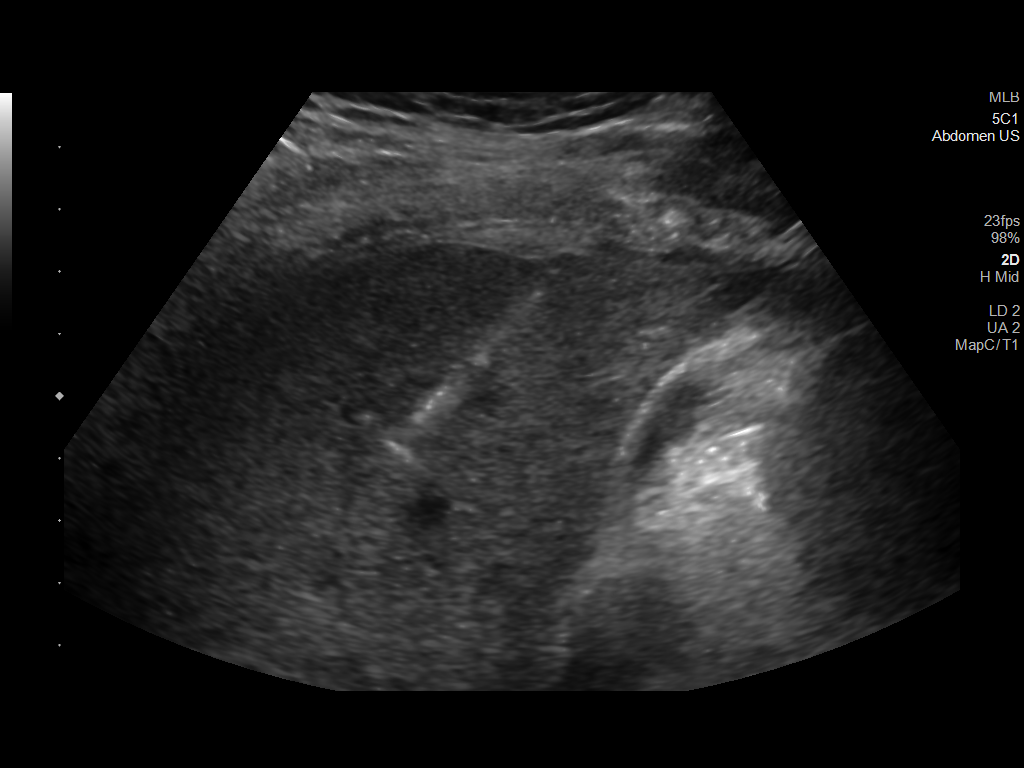
[im 5/7]
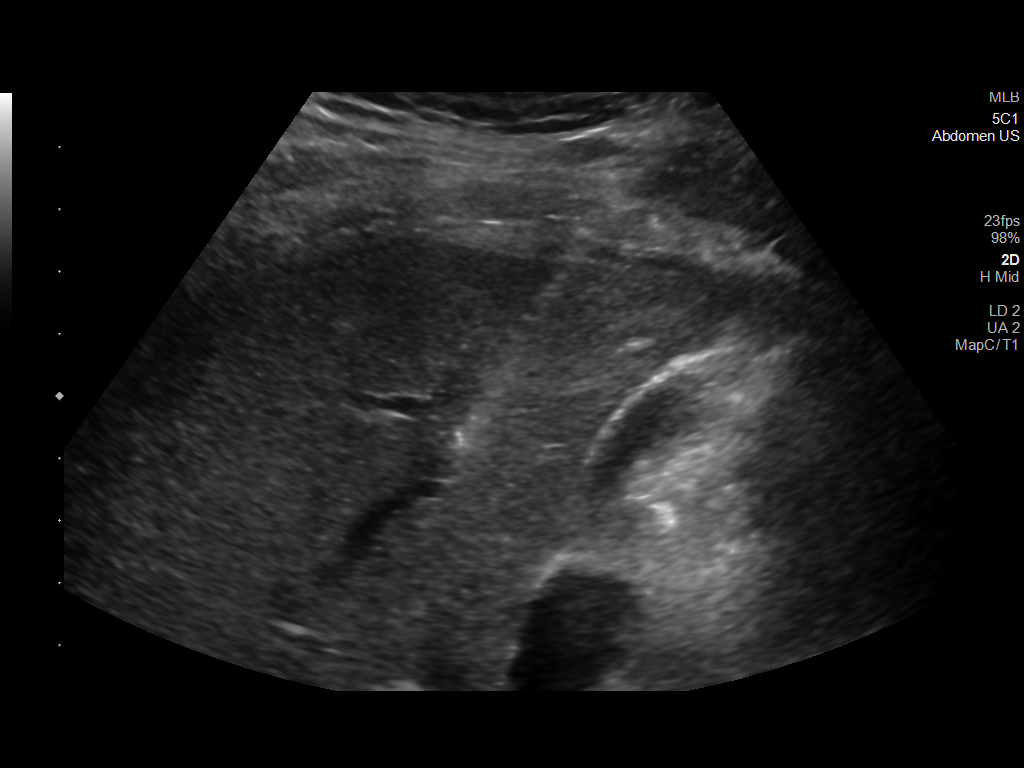
[im 6/7]
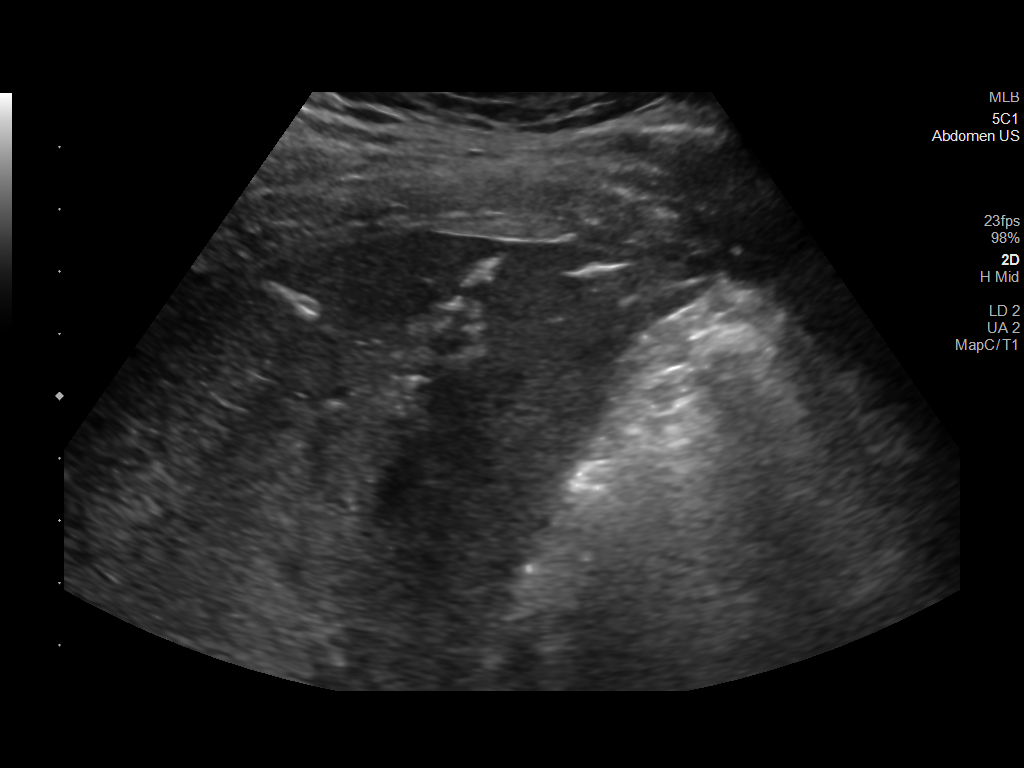
[im 7/7]
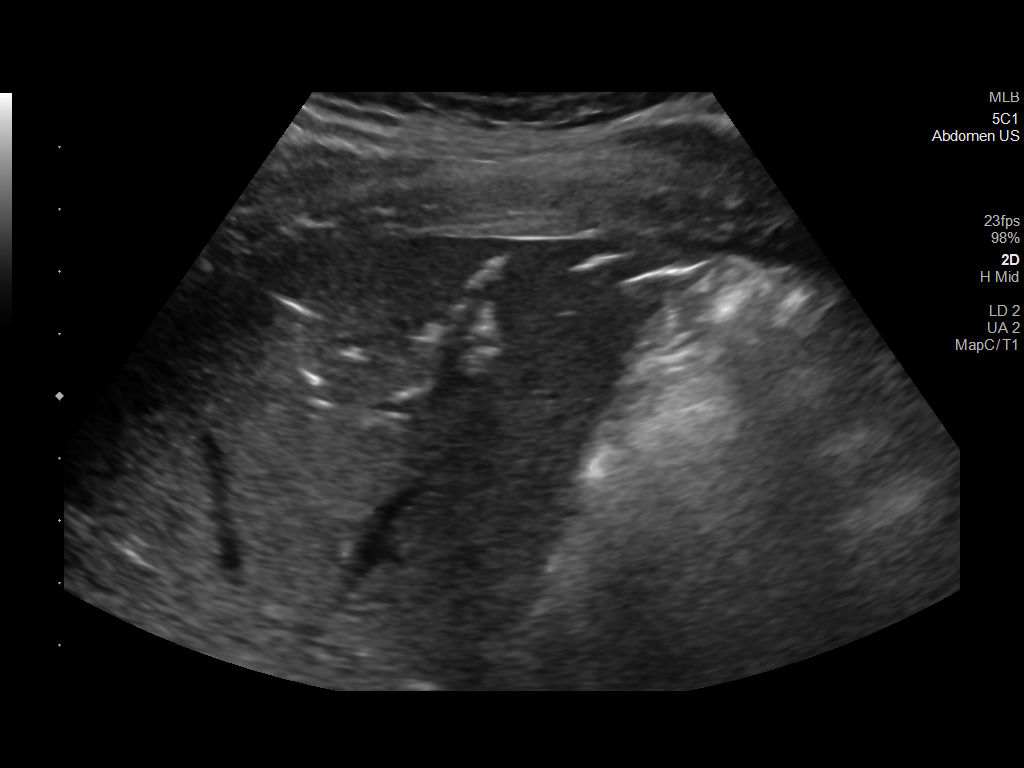

[7 of 7 positions shown; findings below may reference images not displayed]

EXAM:
ULTRASOUND GUIDED CORE BIOPSY OF RIGHT HEPATIC LOBE

MEDICATIONS:
1% LIDOCAINE LOCAL

ANESTHESIA/SEDATION:
Fentanyl 2.0 mcg IV; Versed 100 mg IV

Moderate Sedation Time:  11 MINUTES

The patient was continuously monitored during the procedure by the
interventional radiology nurse under my direct supervision.

PROCEDURE:
The procedure, risks, benefits, and alternatives were explained to
the patient. Questions regarding the procedure were encouraged and
answered. The patient understands and consents to the procedure.

Previous imaging reviewed. Preliminary ultrasound performed. The
right hepatic lobe was localized in the mid axillary line through a
lower intercostal space. Overlying skin marked.

Under sterile conditions and local anesthesia, a 17 gauge coaxial
guide needle was advanced into the right hepatic lobe. Needle
position confirmed with ultrasound. 2 18 gauge core biopsies
obtained. Samples were intact and non fragmented. These were placed
in formalin. Needle tract occluded with Gel-Foam. No immediate
complication. Patient tolerated the procedure well.

COMPLICATIONS:
None immediate.
FINDINGS: Imaging confirms needle placed in the right hepatic lobe for random
core biopsy
IMPRESSION: Successful ultrasound right liver random core biopsy

## 2022-07-17 ENCOUNTER — Ambulatory Visit: Payer: Managed Care, Other (non HMO) | Admitting: Neurology

## 2022-07-17 ENCOUNTER — Encounter: Payer: Self-pay | Admitting: Neurology

## 2022-07-17 DIAGNOSIS — G478 Other sleep disorders: Secondary | ICD-10-CM | POA: Insufficient documentation

## 2022-07-17 DIAGNOSIS — R0683 Snoring: Secondary | ICD-10-CM

## 2022-07-17 NOTE — Progress Notes (Signed)
SLEEP MEDICINE CLINIC    Provider:  Melvyn Novas, MD  Primary Care Physician:  Charlane Ferretti, DO [address] [place] [address]     Referring Provider: Charlane Ferretti, Do [address] [place],  [place] [address]          Chief Complaint according to patient   Patient presents with:     New Patient (Initial Visit)     Pt is well, states he is having some fatigue in the morning, snoring and waking up often during the night.       HISTORY OF PRESENT ILLNESS:  Louis Warren is a 36 y.o.  African American male patient seen here as a referral on 07/17/2022 from PCP for a .  Chief concern according to patient :  I used to snore more a until about 6-8 months ago, still do- but not as loud, not as many pauses. No weight changes.   I have the pleasure of seeing Louis Warren today, a right -handed Black or [place] American male with a possible sleep disorder.  He has a past medical history of Dizziness, Elevated LFTs, and Palpitations.   Sleep relevant medical history:  deviated septum, non- restorative sleep    Family medical /sleep history: no other family member on CPAP with OSA, insomnia, sleep walkers.    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 The patient currently works/ used to work in shifts( night/ rotating) Tobacco use: never.  ETOH use : very rare,  Caffeine intake in form of Coffee( 1 a day) Soda( /) Tea ( lunch 1-2 ) , no energy drinks. Regular exercise in form of 15 minutes a day- gym , cardio.        Sleep habits are as follows: The patient's dinner time is between 6-7 PM. The patient goes to bed at 9.30 PM and continues to sleep for 4 hours, screen type in bed -wakes for 0-1 bathroom breaks.  The preferred sleep position is  laterally/ supine , with the support of 2 pillows. One is chiropractor memory foam.  Dreams are reportedly frequent/vivid. He sleeps  well for the first 4 hours and then  dozes more than sleep.    6. 30 AM is the usual rise time. The patient wakes up at 6 with an alarm.  He  reports not feeling fully refreshed or restored in AM, with symptoms such as dry mouth, no morning headaches, and residual fatigue.  Naps are taken infrequently, lasting from 10-15  minutes and are  refreshing .    Review of Systems: Out of a complete 14 system review, the patient complains of only the following symptoms, and all other reviewed systems are negative.:  Fatigue, sleepiness , snoring, fragmented sleep, Insomnia for the later half of sleep.    How likely are you to doze in the following situations: 0 = not likely, 1 = slight chance, 2 = moderate chance, 3 = high chance   Sitting and Reading? Watching Television? Sitting inactive in a public place (theater or meeting)? As a passenger in a car for an hour without a break? Lying down in the afternoon when circumstances permit? Sitting and talking to someone? Sitting quietly after lunch without alcohol? In a car, while stopped for a few minutes in traffic?   Total = 1/ 24 points   FSS endorsed at  16/ 63 points.   Social History   Socioeconomic History   Marital status: Married    Spouse name: Not on file   Number of children: Not on file   Years of education: Not on file   Highest education level: Not on file  Occupational History   Not on file  Tobacco Use   Smoking status: Never   Smokeless tobacco: Never  Vaping Use   Vaping Use: Never used  Substance and Sexual Activity   Alcohol use: Yes    Comment: occ-1 time per month   Drug use: Never   Sexual activity: Not on file  Other Topics Concern   Not on file  Social History Narrative   Not on file   Social Determinants of Health   Financial Resource Strain: Not on file  Food Insecurity: Not on file  Transportation Needs: Not on file  Physical Activity: Not on file  Stress: Not on file  Social Connections: Not on  file    Family History  Problem Relation Age of Onset   Heart disease Mother    Hypertension Mother    Diabetes Mother    Diabetes Father    Diabetes Brother    Diabetes Maternal Grandmother    Colon cancer Neg Hx    Stomach cancer Neg Hx    Esophageal cancer Neg Hx    Pancreatic cancer Neg Hx     Past Medical History:  Diagnosis Date   Dizziness    Elevated LFTs    Palpitations     Past Surgical History:  Procedure Laterality Date   NO PAST SURGERIES     WISDOM TOOTH EXTRACTION       Current Outpatient Medications on File Prior to Visit  Medication Sig Dispense Refill   tretinoin (RETIN-A) 0.1 % cream Apply 1 application topically daily as needed (acne).      No current facility-administered medications on file prior to visit.    No Known Allergies  Physical exam:  Today's Vitals   07/17/22 0843  BP: 121/76  Pulse: 72  Weight: 245 lb (111.1 kg)  Height: 6\' 5"  (1.956 m)   Body mass index is 29.05 kg/m.   Wt Readings from Last 3 Encounters:  07/17/22 245 lb (111.1 kg)  10/01/20 240 lb (108.9 kg)  09/25/20 235 lb (106.6 kg)     Ht Readings from Last 3 Encounters:  07/17/22 6\' 5"  (1.956 m)  10/01/20 6\' 5"  (1.956 m)  09/25/20 6\' 5"  (1.956 m)      General: The patient is awake, alert and appears not in acute distress. The patient is well groomed. Head: Normocephalic, atraumatic. Neck is supple. Mallampati 1,  neck circumference:17.5 inches .  Nasal airflow on the left is not fully patent.  Retrognathia is  seen.  Dental status: biological  Cardiovascular:  Regular rate and cardiac rhythm by pulse,  without distended neck veins. Respiratory: Lungs are clear to auscultation.  Skin:  Without evidence of ankle edema, or rash. Trunk: The patient's posture is erect.   Neurologic exam : The patient is awake and alert, oriented to place and time.   Memory subjective described as intact.  Attention span & concentration ability appears normal.  Speech is  fluent,  without  dysarthria or aphasia.  Mood and affect are appropriate.   Cranial nerves: no loss of smell or taste reported  Pupils are equal and briskly reactive to light. Funduscopic exam deferred. .  Extraocular movements in vertical and horizontal planes  were intact and without nystagmus. No Diplopia. Visual fields by finger perimetry are intact. Hearing was intact to soft voice and finger rubbing.    Facial sensation intact to fine touch.  Facial motor strength is symmetric and tongue and uvula move midline.  Neck ROM : rotation, tilt and flexion extension were normal for age and shoulder shrug was symmetrical.    Motor exam:  Symmetric bulk, tone and ROM.   Normal tone without cog wheeling, symmetric grip strength .   Sensory:  Fine touch and vibration were normal.  Proprioception tested in the upper extremities was normal.   Coordination: Rapid alternating movements in the fingers/hands were of normal speed.  The Finger-to-nose maneuver was intact without evidence of ataxia, dysmetria or tremor.   Gait and station: Patient could rise unassisted from a seated position, walked without assistive device.  Stance is of normal width/ base and the patient turned with 3 steps ( RN observation) .  Toe and heel walk were deferred.  Deep tendon reflexes: in the  upper and lower extremities are symmetric and intact.  Babinski response was deferred .       After spending a total time of  30  minutes face to face and additional time for physical and neurologic examination, review of laboratory studies,  personal review of imaging studies, reports and results of other testing and review of referral information / records as far as provided in visit, I have established the following assessments:  1) mainly concerned about non restorative sleep, affecting the later half of the night.  2) snoring. 3) no palpitations at night. His palps were related to dehydration.    My Plan is to proceed  with:  1) I feel a HST for screening should suffice.   I would like to thank Charlane Ferretti, DO and Charlane Ferretti, Do [address] [place],  [place] [address] for allowing me to meet with and to take care of this pleasant patient.   In short, Louis Warren is presenting with no restorative sleep.  I plan to follow up either personally or through our NP within 2-4 months.   CC: I will share my notes with PCP, Dr Charlane Ferretti, DO> .  Electronically signed by: Melvyn Novas, MD 07/17/2022 9:23 AM  [facility] Board certified by The [employer] of Sleep Medicine and Diplomate of the [employer] of Sleep Medicine. Board certified In Neurology through the ABPN, Fellow of the [employer] of Neurology. Medical Director of [facility].

## 2022-07-17 NOTE — Patient Instructions (Signed)
Quality Sleep Information, Adult Quality sleep is important for your mental and physical health. It also improves your quality of life. Quality sleep means you: Are asleep for most of the time you are in bed. Fall asleep within 30 minutes. Wake up no more than once a night. Are awake for no longer than 20 minutes if you do wake up during the night. Most adults need 7-8 hours of quality sleep each night. How can poor sleep affect me? If you do not get enough quality sleep, you may have: Mood swings. Daytime sleepiness. Decreased alertness, reaction time, and concentration. Sleep disorders, such as insomnia and sleep apnea. Difficulty with: Solving problems. Coping with stress. Paying attention. These issues may affect your performance and productivity at work, school, and home. Lack of sleep may also put you at higher risk for accidents, suicide, and risky behaviors. If you do not get quality sleep, you may also be at higher risk for several health problems, including: Infections. Type 2 diabetes. Heart disease. High blood pressure. Obesity. Worsening of long-term conditions, like arthritis, kidney disease, depression, Parkinson's disease, and epilepsy. What actions can I take to get more quality sleep? Sleep schedule and routine Stick to a sleep schedule. Go to sleep and wake up at about the same time each day. Do not try to sleep less on weekdays and make up for lost sleep on weekends. This does not work. Limit naps during the day to 30 minutes or less. Do not take naps in the late afternoon. Make time to relax before bed. Reading, listening to music, or taking a hot bath promotes quality sleep. Make your bedroom a place that promotes quality sleep. Keep your bedroom dark, quiet, and at a comfortable room temperature. Make sure your bed is comfortable. Avoid using electronic devices that give off bright blue light for 30 minutes before bedtime. Your brain perceives bright blue light  as sunlight. This includes television, phones, and computers. If you are lying awake in bed for longer than 20 minutes, get up and do a relaxing activity until you feel sleepy. Lifestyle     Try to get at least 30 minutes of exercise on most days. Do not exercise 2-3 hours before going to bed. Do not use any products that contain nicotine or tobacco. These products include cigarettes, chewing tobacco, and vaping devices, such as e-cigarettes. If you need help quitting, ask your health care provider. Do not drink caffeinated beverages for at least 8 hours before going to bed. Coffee, tea, and some sodas contain caffeine. Do not drink alcohol or eat large meals close to bedtime. Try to get at least 30 minutes of sunlight every day. Morning sunlight is best. Medical concerns Work with your health care provider to treat medical conditions that may affect sleeping, such as: Nasal obstruction. Snoring. Sleep apnea and other sleep disorders. Talk to your health care provider if you think any of your prescription medicines may cause you to have difficulty falling or staying asleep. If you have sleep problems, talk with a sleep consultant. If you think you have a sleep disorder, talk with your health care provider about getting evaluated by a specialist. Where to find more information Sleep Foundation: sleepfoundation.org American Academy of Sleep Medicine: aasm.org Centers for Disease Control and Prevention (CDC): cdc.gov Contact a health care provider if: You have trouble getting to sleep or staying asleep. You often wake up very early in the morning and cannot get back to sleep. You have daytime sleepiness. You   have daytime sleep attacks of suddenly falling asleep and sudden muscle weakness (narcolepsy). You have a tingling sensation in your legs with a strong urge to move your legs (restless legs syndrome). You stop breathing briefly during sleep (sleep apnea). You think you have a sleep  disorder or are taking a medicine that is affecting your quality of sleep. Summary Most adults need 7-8 hours of quality sleep each night. Getting enough quality sleep is important for your mental and physical health. Make your bedroom a place that promotes quality sleep, and avoid things that may cause you to have poor sleep, such as alcohol, caffeine, smoking, or large meals. Talk to your health care provider if you have trouble falling asleep or staying asleep. This information is not intended to replace advice given to you by your health care provider. Make sure you discuss any questions you have with your health care provider. Document Revised: 02/19/2022 Document Reviewed: 02/19/2022 Elsevier Patient Education  2023 Elsevier Inc.  

## 2022-08-25 ENCOUNTER — Telehealth: Payer: Self-pay | Admitting: Neurology

## 2022-08-25 NOTE — Telephone Encounter (Signed)
HST- [employer] no auth req ref # [id number].  Patient is scheduled at [facility] for 09/16/22 at 8 AM.  Mailed packet to the patient.

## 2022-09-16 ENCOUNTER — Ambulatory Visit: Payer: Managed Care, Other (non HMO) | Admitting: Neurology

## 2022-09-16 DIAGNOSIS — G4733 Obstructive sleep apnea (adult) (pediatric): Secondary | ICD-10-CM | POA: Diagnosis not present

## 2022-09-16 DIAGNOSIS — R0683 Snoring: Secondary | ICD-10-CM

## 2022-09-16 DIAGNOSIS — G478 Other sleep disorders: Secondary | ICD-10-CM

## 2022-09-18 NOTE — Progress Notes (Signed)
[facility] TEST REPORT ( by Watch PAT)   STUDY DATA:  09-18-2022 DOB:  11-25-1985 Cyril Loosen   ORDERING CLINICIAN: Larey Seat, MD  REFERRING CLINICIAN: Dr. 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 Only the first 4 houirs of sleep are sustained, followed by vivid dreaming and light sleep. Daytime naps.     Epworth sleepiness score:1 /24. Fss at 16/ 63 points.    BMI:  29 kg/m   Neck Circumference: 18"   FINDINGS:   Sleep Summary:   Total Recording Time (hours, min):   8 hours and 31 minutes  Total Sleep Time (hours, min): 7 hours 44 minutes               Percent REM (%):   28.3%   Sleep latency was estimated at 20 minutes and REM sleep latency at 59 minutes.                                    Respiratory Indices:   Calculated pAHI (per hour):    7.7/h                         REM pAHI: 9.8/h                                               NREM pAHI: 6.8/h                            Positional AHI: For most of the night the patient slept in supine position associated with an AHI of 9.1/h followed by right lateral sleep with an AHI of 4.2/h.  Left lateral sleep was associated with an AHI of 7.8/h.  Snoring level reached a mean volume of 40 dB which is just at threshold.  Only 3.9% of total sleep time were associated with snoring.                                                  Oxygen Saturation Statistics:                  O2 Saturation Range (%):    Between a nadir at 88% with a maximum of 100% saturation the mean oxygen saturation was 96%.                                   O2 Saturation (minutes) <89%: 0 minutes         Pulse Rate Statistics:   Pulse Mean (bpm): 61 bpm               Pulse Range:   Between 39 and 118 bpm              IMPRESSION:  This HST confirms the presence of very mild obstructive  sleep apnea, slightly accentuated in rem sleep, but by no means REM  sleep dependent.  Also accentuated in supine sleep.   RECOMMENDATION: I do not think that CPAP would be an necessary treatment for this patient who has not reported  (subjective) excessive daytime sleepiness, neither significant fatigue. My recommendation is to avoid supine sleep and either sleep on the right side left side or prone.  This alone seems to reduce his AHI to 5.1/h.  Snoring volume was insignificant. If the patient is indeed from mouth breather, it can be most beneficial to increase nasal airway patency with nasal sprays or ENT procedure.  If there is an allergic component with nasal congestion I would recommend a daily nondrowsy antiallergy medication such as Allegra. Melatonin may help to extend the sleep time over the 4 hours during which the patient finds qualitatively the best sleep.     INTERPRETING PHYSICIAN:   Melvyn Novas, MD   Medical Director of [facility].

## 2022-09-29 ENCOUNTER — Telehealth: Payer: Self-pay

## 2022-09-29 NOTE — Procedures (Signed)
      [facility]   HOME SLEEP TEST REPORT ( by Watch PAT)   STUDY DATA:  09-18-2022 DOB:  09/05/1986 Louis Warren   ORDERING CLINICIAN: Makya Phillis, MD  REFERRING CLINICIAN: Dr. 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 Only the first 4 houirs of sleep are sustained, followed by vivid dreaming and light sleep. Daytime naps.     Epworth sleepiness score:1 /24. Fss at 16/ 63 points.    BMI:  29 kg/m   Neck Circumference: 18"   FINDINGS:   Sleep Summary:   Total Recording Time (hours, min):   8 hours and 31 minutes  Total Sleep Time (hours, min): 7 hours 44 minutes               Percent REM (%):   28.3%   Sleep latency was estimated at 20 minutes and REM sleep latency at 59 minutes.                                    Respiratory Indices:   Calculated pAHI (per hour):    7.7/h                         REM pAHI: 9.8/h                                               NREM pAHI: 6.8/h                            Positional AHI: For most of the night the patient slept in supine position associated with an AHI of 9.1/h followed by right lateral sleep with an AHI of 4.2/h.  Left lateral sleep was associated with an AHI of 7.8/h.  Snoring level reached a mean volume of 40 dB which is just at threshold.  Only 3.9% of total sleep time were associated with snoring.                                                  Oxygen Saturation Statistics:                  O2 Saturation Range (%):    Between a nadir at 88% with a maximum of 100% saturation the mean oxygen saturation was 96%.                                   O2 Saturation (minutes) <89%: 0 minutes         Pulse Rate Statistics:   Pulse Mean (bpm): 61 bpm               Pulse Range:   Between 39 and 118 bpm              IMPRESSION:  This HST confirms the presence of very mild obstructive  sleep apnea, slightly accentuated in rem sleep, but by no means REM   sleep dependent.  Also accentuated in supine sleep.   RECOMMENDATION: I do not think that CPAP would be an necessary treatment for this patient who has not reported  (subjective) excessive daytime sleepiness, neither significant fatigue. My recommendation is to avoid supine sleep and either sleep on the right side left side or prone.  This alone seems to reduce his AHI to 5.1/h.  Snoring volume was insignificant. If the patient is indeed from mouth breather, it can be most beneficial to increase nasal airway patency with nasal sprays or ENT procedure.  If there is an allergic component with nasal congestion I would recommend a daily nondrowsy antiallergy medication such as Allegra. Melatonin may help to extend the sleep time over the 4 hours during which the patient finds qualitatively the best sleep.     INTERPRETING PHYSICIAN:   Kerissa Coia, MD   Medical Director of [facility].                       

## 2022-09-29 NOTE — Telephone Encounter (Signed)
I called patient. I discussed his sleep study results and recommendations. Pt verbalized understanding of results. Pt had no questions at this time but was encouraged to call back if questions arise.

## 2022-09-29 NOTE — Telephone Encounter (Signed)
-----   Message from Melvyn Novas, MD sent at 09/29/2022  7:04 AM EST ----- Very mild sleep apnea, positional treatment may suffice. Significant snoring was not detected ( low volume, only 5% or less of sleep time), yet RDI was high. Lots of dream sleep after 3.30 AM is physiologically normal, may be perceived by the patient as light sleep. Heart rate was variable.   CPAP therapy is optional for such mild forms. See details under recommendations.

## 2022-09-29 NOTE — Progress Notes (Signed)
Very mild sleep apnea, positional treatment may suffice. Significant snoring was not detected ( low volume, only 5% or less of sleep time), yet RDI was high. Lots of dream sleep after 3.30 AM is physiologically normal, may be perceived by the patient as light sleep. Heart rate was variable.   CPAP therapy is optional for such mild forms. See details under recommendations.
